# Patient Record
Sex: Male | Born: 1990 | Race: Black or African American | Hispanic: No | Marital: Single | Smoking: Current some day smoker
Health system: Southern US, Community
[De-identification: ages and names within clinical notes are randomized; demographics above are authoritative.]

## PROBLEM LIST (undated history)

## (undated) DIAGNOSIS — J45909 Unspecified asthma, uncomplicated: Secondary | ICD-10-CM

## (undated) HISTORY — PX: NO PAST SURGERIES: SHX2092

---

## 2013-07-19 ENCOUNTER — Emergency Department (HOSPITAL_COMMUNITY)
Admission: EM | Admit: 2013-07-19 | Discharge: 2013-07-19 | Disposition: A | Discharge status: Home / Self Care | Payer: BC Managed Care – PPO | Source: Home / Self Care

## 2013-07-19 ENCOUNTER — Encounter (HOSPITAL_COMMUNITY): Payer: Self-pay | Admitting: Emergency Medicine

## 2013-07-19 DIAGNOSIS — J45901 Unspecified asthma with (acute) exacerbation: Secondary | ICD-10-CM

## 2013-07-19 MED ORDER — ALBUTEROL SULFATE (2.5 MG/3ML) 0.083% IN NEBU: 2.5000 mg | INHALATION_SOLUTION | Freq: Four times a day (QID) | RESPIRATORY_TRACT | Status: DC | PRN

## 2013-07-19 MED ORDER — ALBUTEROL SULFATE HFA 108 (90 BASE) MCG/ACT IN AERS: 2.0000 | INHALATION_SPRAY | RESPIRATORY_TRACT | Status: DC | PRN

## 2013-07-19 MED ORDER — METHYLPREDNISOLONE 4 MG PO KIT: PACK | ORAL | Status: DC

## 2013-07-19 NOTE — ED Provider Notes (Signed)
Medical screening examination/treatment/procedure(s) were performed by resident physician or non-physician practitioner and as supervising physician I was immediately available for consultation/collaboration.   Japji Kok DOUGLAS MD.   Finn Altemose D Charleston Vierling, MD 07/19/13 1550 

## 2013-07-19 NOTE — Discharge Instructions (Signed)
Asthma Attack Prevention °Although there is no way to prevent asthma from starting, you can take steps to control the disease and reduce its symptoms. Learn about your asthma and how to control it. Take an active role to control your asthma by working with your health care provider to create and follow an asthma action plan. An asthma action plan guides you in: °· Taking your medicines properly. °· Avoiding things that set off your asthma or make your asthma worse (asthma triggers). °· Tracking your level of asthma control. °· Responding to worsening asthma. °· Seeking emergency care when needed. °To track your asthma, keep records of your symptoms, check your peak flow number using a handheld device that shows how well air moves out of your lungs (peak flow meter), and get regular asthma checkups.  °WHAT ARE SOME WAYS TO PREVENT AN ASTHMA ATTACK? °· Take medicines as directed by your health care provider. °· Keep track of your asthma symptoms and level of control. °· With your health care provider, write a detailed plan for taking medicines and managing an asthma attack. Then be sure to follow your action plan. Asthma is an ongoing condition that needs regular monitoring and treatment. °· Identify and avoid asthma triggers. Many outdoor allergens and irritants (such as pollen, mold, cold air, and air pollution) can trigger asthma attacks. Find out what your asthma triggers are and take steps to avoid them. °· Monitor your breathing. Learn to recognize warning signs of an attack, such as coughing, wheezing, or shortness of breath. Your lung function may decrease before you notice any signs or symptoms, so regularly measure and record your peak airflow with a home peak flow meter. °· Identify and treat attacks early. If you act quickly, you are less likely to have a severe attack. You will also need less medicine to control your symptoms. When your peak flow measurements decrease and alert you to an upcoming attack,  take your medicine as instructed and immediately stop any activity that may have triggered the attack. If your symptoms do not improve, get medical help. °· Pay attention to increasing quick-relief inhaler use. If you find yourself relying on your quick-relief inhaler, your asthma is not under control. See your health care provider about adjusting your treatment. °WHAT CAN MAKE MY SYMPTOMS WORSE? °A number of common things can set off or make your asthma symptoms worse and cause temporary increased inflammation of your airways. Keep track of your asthma symptoms for several weeks, detailing all the environmental and emotional factors that are linked with your asthma. When you have an asthma attack, go back to your asthma diary to see which factor, or combination of factors, might have contributed to it. Once you know what these factors are, you can take steps to control many of them. If you have allergies and asthma, it is important to take asthma prevention steps at home. Minimizing contact with the substance to which you are allergic will help prevent an asthma attack. Some triggers and ways to avoid these triggers are: °Animal Dander:  °Some people are allergic to the flakes of skin or dried saliva from animals with fur or feathers.  °· There is no such thing as a hypoallergenic dog or cat breed. All dogs or cats can cause allergies, even if they don't shed. °· Keep these pets out of your home. °· If you are not able to keep a pet outdoors, keep the pet out of your bedroom and other sleeping areas at all   times, and keep the door closed. °· Remove carpets and furniture covered with cloth from your home. If that is not possible, keep the pet away from fabric-covered furniture and carpets. °Dust Mites: °Many people with asthma are allergic to dust mites. Dust mites are tiny bugs that are found in every home in mattresses, pillows, carpets, fabric-covered furniture, bedcovers, clothes, stuffed toys, and other  fabric-covered items.  °· Cover your mattress in a special dust-proof cover. °· Cover your pillow in a special dust-proof cover, or wash the pillow each week in hot water. Water must be hotter than 130° F (54.4° C) to kill dust mites. Cold or warm water used with detergent and bleach can also be effective. °· Wash the sheets and blankets on your bed each week in hot water. °· Try not to sleep or lie on cloth-covered cushions. °· Call ahead when traveling and ask for a smoke-free hotel room. Bring your own bedding and pillows in case the hotel only supplies feather pillows and down comforters, which may contain dust mites and cause asthma symptoms. °· Remove carpets from your bedroom and those laid on concrete, if you can. °· Keep stuffed toys out of the bed, or wash the toys weekly in hot water or cooler water with detergent and bleach. °Cockroaches: °Many people with asthma are allergic to the droppings and remains of cockroaches.  °· Keep food and garbage in closed containers. Never leave food out. °· Use poison baits, traps, powders, gels, or paste (for example, boric acid). °· If a spray is used to kill cockroaches, stay out of the room until the odor goes away. °Indoor Mold: °· Fix leaky faucets, pipes, or other sources of water that have mold around them. °· Clean floors and moldy surfaces with a fungicide or diluted bleach. °· Avoid using humidifiers, vaporizers, or swamp coolers. These can spread molds through the air. °Pollen and Outdoor Mold: °· When pollen or mold spore counts are high, try to keep your windows closed. °· Stay indoors with windows closed from late morning to afternoon. Pollen and some mold spore counts are highest at that time. °· Ask your health care provider whether you need to take anti-inflammatory medicine or increase your dose of the medicine before your allergy season starts. °Other Irritants to Avoid: °· Tobacco smoke is an irritant. If you smoke, ask your health care provider how  you can quit. Ask family members to quit smoking too. Do not allow smoking in your home or car. °· If possible, do not use a wood-burning stove, kerosene heater, or fireplace. Minimize exposure to all sources of smoke, including to incense, candles, fires, and fireworks. °· Try to stay away from strong odors and sprays, such as perfume, talcum powder, hair spray, and paints. °· Decrease humidity in your home and use an indoor air cleaning device. Reduce indoor humidity to below 60%. Dehumidifiers or central air conditioners can do this. °· Decrease house dust exposure by changing furnace and air cooler filters frequently. °· Try to have someone else vacuum for you once or twice a week. Stay out of rooms while they are being vacuumed and for a short while afterward. °· If you vacuum, use a dust mask from a hardware store, a double-layered or microfilter vacuum cleaner bag, or a vacuum cleaner with a HEPA filter. °· Sulfites in foods and beverages can be irritants. Do not drink beer or wine or eat dried fruit, processed potatoes, or shrimp if they cause asthma symptoms. °·   Cold air can trigger an asthma attack. Cover your nose and mouth with a scarf on cold or windy days. °· Several health conditions can make asthma more difficult to manage, including a runny nose, sinus infections, reflux disease, psychological stress, and sleep apnea. Work with your health care provider to manage these conditions. °· Avoid close contact with people who have a respiratory infection such as a cold or the flu, since your asthma symptoms may get worse if you catch the infection. Wash your hands thoroughly after touching items that may have been handled by people with a respiratory infection. °· Get a flu shot every year to protect against the flu virus, which often makes asthma worse for days or weeks. Also get a pneumonia shot if you have not previously had one. Unlike the flu shot, the pneumonia shot does not need to be given  yearly. °Medicines: °· Talk to your health care provider about whether it is safe for you to take aspirin or non-steroidal anti-inflammatory medicines (NSAIDs). In a small number of people with asthma, aspirin and NSAIDs can cause asthma attacks. These medicines must be avoided by people who have known aspirin-sensitive asthma. It is important that people with aspirin-sensitive asthma read labels of all over-the-counter medicines used to treat pain, colds, coughs, and fever. °· Beta blockers and ACE inhibitors are other medicines you should discuss with your health care provider. °HOW CAN I FIND OUT WHAT I AM ALLERGIC TO? °Ask your asthma health care provider about allergy skin testing or blood testing (the RAST test) to identify the allergens to which you are sensitive. If you are found to have allergies, the most important thing to do is to try to avoid exposure to any allergens that you are sensitive to as much as possible. Other treatments for allergies, such as medicines and allergy shots (immunotherapy) are available.  °CAN I EXERCISE? °Follow your health care provider's advice regarding asthma treatment before exercising. It is important to maintain a regular exercise program, but vigorous exercise, or exercise in cold, humid, or dry environments can cause asthma attacks, especially for those people who have exercise-induced asthma. °Document Released: 04/09/2009 Document Revised: 12/22/2012 Document Reviewed: 10/27/2012 °[employer]® Patient Information ©2014 [employer]. ° °Asthma, Acute Bronchospasm °Acute bronchospasm caused by asthma is also referred to as an asthma attack. Bronchospasm means your air passages become narrowed. The narrowing is caused by inflammation and tightening of the muscles in the air tubes (bronchi) in your lungs. This can make it hard to breath or cause you to wheeze and cough. °CAUSES °Possible triggers are: °· Animal dander from the skin, hair, or feathers of animals. °· Dust  mites contained in house dust. °· Cockroaches. °· Pollen from trees or grass. °· Mold. °· Cigarette or tobacco smoke. °· Air pollutants such as dust, household cleaners, hair sprays, aerosol sprays, paint fumes, strong chemicals, or strong odors. °· Cold air or weather changes. Cold air may trigger inflammation. Winds increase molds and pollens in the air. °· Strong emotions such as crying or laughing hard. °· Stress. °· Certain medicines such as aspirin or beta-blockers. °· Sulfites in foods and drinks, such as dried fruits and wine. °· Infections or inflammatory conditions, such as a flu, cold, or inflammation of the nasal membranes (rhinitis). °· Gastroesophageal reflux disease (GERD). GERD is a condition where stomach acid backs up into your throat (esophagus). °· Exercise or strenuous activity. °SIGNS AND SYMPTOMS  °· Wheezing. °· Excessive coughing, particularly at night. °· Chest tightness. °·   Shortness of breath. °DIAGNOSIS  °Your health care provider will ask you about your medical history and perform a physical exam. A chest X-ray or blood testing may be performed to look for other causes of your symptoms or other conditions that may have triggered your asthma attack.  °TREATMENT  °Treatment is aimed at reducing inflammation and opening up the airways in your lungs.  Most asthma attacks are treated with inhaled medicines. These include quick relief or rescue medicines (such as bronchodilators) and controller medicines (such as inhaled corticosteroids). These medicines are sometimes given through an inhaler or a nebulizer. Systemic steroid medicine taken by mouth or given through an IV tube also can be used to reduce the inflammation when an attack is moderate or severe. Antibiotic medicines are only used if a bacterial infection is present.  °HOME CARE INSTRUCTIONS  °· Rest. °· Drink plenty of liquids. This helps the mucus to remain thin and be easily coughed up. Only use caffeine in moderation and do not  use alcohol until you have recovered from your illness. °· Do not smoke. Avoid being exposed to secondhand smoke. °· You play a critical role in keeping yourself in good health. Avoid exposure to things that cause you to wheeze or to have breathing problems. °· Keep your medicines up to date and available. Carefully follow your health care provider's treatment plan. °· Take your medicine exactly as prescribed. °· When pollen or pollution is bad, keep windows closed and use an air conditioner or go to places with air conditioning. °· Asthma requires careful medical care. See your health care provider for a follow-up as advised. If you are more than [redacted] weeks pregnant and you were prescribed any new medicines, let your obstetrician know about the visit and how you are doing. Follow-up with your health care provider as directed. °· After you have recovered from your asthma attack, make an appointment with your outpatient doctor to talk about ways to reduce the likelihood of future attacks. If you do not have a doctor who manages your asthma, make an appointment with a primary care doctor to discuss your asthma. °SEEK IMMEDIATE MEDICAL CARE IF:  °· You are getting worse. °· You have trouble breathing. If severe, call your local emergency services (911 in the [place] °· You develop chest pain or discomfort. °· You are vomiting. °· You are not able to keep fluids down. °· You are coughing up yellow, green, brown, or bloody sputum. °· You have a fever and your symptoms suddenly get worse. °· You have trouble swallowing. °MAKE SURE YOU:  °· Understand these instructions. °· Will watch your condition. °· Will get help right away if you are not doing well or get worse. °Document Released: 08/06/2006 Document Revised: 12/22/2012 Document Reviewed: 10/27/2012 °[employer]® Patient Information ©2014 [employer]. ° °

## 2013-07-19 NOTE — ED Provider Notes (Signed)
CSN: [id number]     Arrival date & time 07/19/13  1139 History   First MD Initiated Contact with Patient 07/19/13 1338     Chief Complaint  Patient presents with  . 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 It is associated with cough and shortness of breath with exertion. He is from [place] where his doctor he is. He is now out of his albuterol HFA and nebulizer solution.   History reviewed. No pertinent past medical history. No past surgical history on file. No family history on file. History  Substance Use Topics  . Smoking status: Not on file  . Smokeless tobacco: Not on file  . Alcohol Use: Not on file    Review of Systems  Constitutional: Positive for activity change. Negative for fever and fatigue.  HENT: Positive for postnasal drip. Negative for sore throat.   Respiratory: Positive for cough and wheezing. Negative for shortness of breath.   Cardiovascular: Negative.   Gastrointestinal: Negative.   Genitourinary: Negative.   Neurological: Negative.     Allergies  Review of patient's allergies indicates no known allergies.  Home Medications   Current Outpatient Rx  Name  Route  Sig  Dispense  Refill  . albuterol (PROVENTIL HFA;VENTOLIN HFA) 108 (90 BASE) MCG/ACT inhaler   Inhalation   Inhale 2 puffs into the lungs every 4 (four) hours as needed for wheezing or shortness of breath.   1 Inhaler   0   . albuterol (PROVENTIL) (2.5 MG/3ML) 0.083% nebulizer solution   Nebulization   Take 3 mLs (2.5 mg total) by nebulization every 6 (six) hours as needed for wheezing or shortness of breath.   75 mL   0   . methylPREDNISolone (MEDROL DOSEPAK) 4 MG tablet      follow package directions   21 tablet   0    BP 130/78  Pulse 84  Temp(Src) 97.8 F (36.6 C)  (Oral)  Resp 16  SpO2 99% Physical Exam  Nursing note and vitals reviewed. Constitutional: He is oriented to person, place, and time. He appears well-developed and well-nourished. No distress.  HENT:  Mouth/Throat: Oropharynx is clear and moist. No oropharyngeal exudate.  Mild clear PND  Eyes: EOM are normal.  Neck: Normal range of motion. Neck supple.  Cardiovascular: Normal rate and regular rhythm.   Pulmonary/Chest: Effort normal. No respiratory distress.  Air movement is fair to good. Mild coarseness  and  wheeze with forced expiration and cough.  Musculoskeletal: He exhibits no edema.  Lymphadenopathy:    He has no cervical adenopathy.  Neurological: He is alert and oriented to person, place, and time. He exhibits normal muscle tone.  Skin: Skin is warm and dry. He is not diaphoretic.  Psychiatric: He has a normal mood and affect.    ED Course  Procedures (including critical care time) Labs Review Labs Reviewed - No data to display Imaging Review No results found.   MDM   1. Asthma, intrinsic with exacerbation      Albuterol HFA Albuterol neb sol .083% Medrol dose pack.       Hayden Rasmussenavid Johncarlos Holtsclaw, NP 07/19/13 1356

## 2013-07-19 NOTE — ED Notes (Signed)
C/o asthma States he is out of medication and would like a refill on albuterol since yesterday States he is having trouble breathing No PCP

## 2013-08-22 ENCOUNTER — Emergency Department (HOSPITAL_COMMUNITY)
Admission: EM | Admit: 2013-08-22 | Discharge: 2013-08-22 | Disposition: A | Discharge status: Home / Self Care | Payer: BC Managed Care – PPO | Attending: Emergency Medicine | Admitting: Emergency Medicine

## 2013-08-22 ENCOUNTER — Encounter (HOSPITAL_COMMUNITY): Payer: Self-pay | Admitting: Emergency Medicine

## 2013-08-22 DIAGNOSIS — S80211A Abrasion, right knee, initial encounter: Secondary | ICD-10-CM

## 2013-08-22 DIAGNOSIS — S0512XA Contusion of eyeball and orbital tissues, left eye, initial encounter: Secondary | ICD-10-CM

## 2013-08-22 DIAGNOSIS — S0510XA Contusion of eyeball and orbital tissues, unspecified eye, initial encounter: Secondary | ICD-10-CM | POA: Insufficient documentation

## 2013-08-22 DIAGNOSIS — IMO0002 Reserved for concepts with insufficient information to code with codable children: Secondary | ICD-10-CM | POA: Insufficient documentation

## 2013-08-22 DIAGNOSIS — J45909 Unspecified asthma, uncomplicated: Secondary | ICD-10-CM | POA: Insufficient documentation

## 2013-08-22 DIAGNOSIS — Z79899 Other long term (current) drug therapy: Secondary | ICD-10-CM | POA: Insufficient documentation

## 2013-08-22 DIAGNOSIS — S80212A Abrasion, left knee, initial encounter: Secondary | ICD-10-CM

## 2013-08-22 HISTORY — DX: Unspecified asthma, uncomplicated: J45.909
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
## 2013-08-22 NOTE — Discharge Instructions (Signed)
You may use tylneol and ibuprofen as needed for pain as well as alternate ice and heat for facial pain relief. See below for further instruction.
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
## 2013-08-25 NOTE — ED Provider Notes (Signed)
Medical screening examination/treatment/procedure(s) were performed by non-physician practitioner and as supervising physician I was immediately available for consultation/collaboration.   EKG Interpretation None        Dagmar HaitWilliam Vondell Sowell, MD 08/25/13 1751

## 2015-07-20 ENCOUNTER — Ambulatory Visit
Admission: EM | Admit: 2015-07-20 | Discharge: 2015-07-20 | Disposition: A | Discharge status: Home / Self Care | Payer: Self-pay | Attending: Family Medicine | Admitting: Family Medicine

## 2015-07-20 DIAGNOSIS — J101 Influenza due to other identified influenza virus with other respiratory manifestations: Secondary | ICD-10-CM

## 2015-07-20 DIAGNOSIS — Z760 Encounter for issue of repeat prescription: Secondary | ICD-10-CM

## 2015-07-20 LAB — RAPID INFLUENZA A&B ANTIGENS (ARMC ONLY): INFLUENZA A (ARMC): NEGATIVE

## 2015-07-20 LAB — RAPID INFLUENZA A&B ANTIGENS: Influenza B (ARMC): POSITIVE — AB

## 2015-07-20 MED ORDER — IBUPROFEN 800 MG PO TABS
800.0000 mg | ORAL_TABLET | Freq: Once | ORAL | Status: AC
  Administered 2015-07-20: 800 mg via ORAL

## 2015-07-20 MED ORDER — ALBUTEROL SULFATE HFA 108 (90 BASE) MCG/ACT IN AERS: 2.0000 | INHALATION_SPRAY | RESPIRATORY_TRACT | Status: DC | PRN

## 2015-07-20 MED ORDER — BENZONATATE 100 MG PO CAPS: 100.0000 mg | ORAL_CAPSULE | Freq: Three times a day (TID) | ORAL | Status: DC | PRN

## 2015-07-20 MED ORDER — OSELTAMIVIR PHOSPHATE 75 MG PO CAPS: 75.0000 mg | ORAL_CAPSULE | Freq: Two times a day (BID) | ORAL | Status: DC

## 2015-07-20 MED ORDER — ALBUTEROL SULFATE (2.5 MG/3ML) 0.083% IN NEBU: 2.5000 mg | INHALATION_SOLUTION | Freq: Four times a day (QID) | RESPIRATORY_TRACT | Status: DC | PRN

## 2015-07-20 NOTE — ED Provider Notes (Signed)
[facility]  ____________________________________________  Time seen: Approximately 9:31 PM  I have reviewed the triage vital signs and the nursing notes.   HISTORY  Chief Complaint Fever  HPI Curtis Rodgers is a 25 y.o. male presents with mother at bedside for the complaints of runny nose, nasal congestion, cough, body aches and subjective fever since yesterday. Denies sore throat. States cough is a dry cough. Reports did not measure fever at home. States he has not been wheezing with his cough. Reports continues to drink fluids well but slight decrease in appetite. Denies known sick contacts. Patient mother reports that patient has a history of asthma and does not currently have any albuterol nebulizer solutions or inhaler. Patient mother states that they're nervous that patient may end up needing these medicines in which she does not have. Patient mother reports that his asthma is well controlled however occasionally when he is sick he will wheeze.  Reports has only taken over-the-counter Mucinex this morning. Reports has not taken any over-the-counter Tylenol or ibuprofen today.  Denies chest pain, shortness of breath, dizziness, weakness, hearing changes, abdominal pain, dysuria, neck pain, back pain, extremity pain or extremity swelling. Denies recent sickness.    Past Medical History  Diagnosis Date  . Asthma     There are no active problems to display for this patient.   Past Surgical History  Procedure Laterality Date  . No past surgeries      Current Outpatient Rx  Name  Route  Sig  Dispense  Refill  .           .           .             Allergies Review of patient's allergies indicates no known allergies.  History reviewed. No pertinent family history.  Social History Social History  Substance Use Topics  . Smoking status: Current Some Day Smoker    Types: E-cigarettes  . Smokeless tobacco: None  . Alcohol Use: 1.8 oz/week    3 Cans of beer per  week    Review of Systems Constitutional: Positive fevers. Eyes: No visual changes. ENT: No sore throat. Positive runny nose, nasal congestion and cough. Cardiovascular: Denies chest pain. Respiratory: Denies shortness of breath. Gastrointestinal: No abdominal pain.  No nausea, no vomiting.  No diarrhea.  No constipation. Genitourinary: Negative for dysuria. Musculoskeletal: Negative for back pain. Skin: Negative for rash. Neurological: Negative for headaches, focal weakness or numbness.  10-point ROS otherwise negative.  ____________________________________________   PHYSICAL EXAM:  VITAL SIGNS: ED Triage Vitals  Enc Vitals Group     BP 07/20/15 2045 137/98 mmHg     Pulse Rate 07/20/15 2045 110     Resp 07/20/15 2045 18     Temp 07/20/15 2045 100.8 F (38.2 C)     Temp Source 07/20/15 2045 Tympanic     SpO2 07/20/15 2045 98 %     Weight 07/20/15 2045 175 lb (79.379 kg)     Height 07/20/15 2045 5\' 10"  (1.778 m)     Head Cir --      Peak Flow --      Pain Score 07/20/15 2047 6     Pain Loc --      Pain Edu? --      Excl. in GC? --   Constitutional: Alert and oriented. Well appearing and in no acute distress. Eyes: Conjunctivae are normal. PERRL. EOMI. Head: Atraumatic. No sinus tenderness to palpation. No  swelling. No erythema.  Ears: no erythema, normal TMs bilaterally.   Nose:Nasal congestion with clear rhinorrhea  Mouth/Throat: Mucous membranes are moist. No pharyngeal erythema. No tonsillar swelling or exudate.  Neck: No stridor.  No cervical spine tenderness to palpation. Hematological/Lymphatic/Immunilogical: No cervical lymphadenopathy. Cardiovascular: Normal rate, regular rhythm. Grossly normal heart sounds.  Good peripheral circulation. Respiratory: Normal respiratory effort.  No retractions. Lungs CTAB. No wheezes, rales or rhonchi. Good air movement.  Gastrointestinal: Soft and nontender. Normal Bowel sounds. No CVA tenderness. Musculoskeletal: No lower or  upper extremity tenderness nor edema. No cervical, thoracic or lumbar tenderness to palpation. Neurologic:  Normal speech and language. No gross focal neurologic deficits are appreciated. No gait instability. Skin:  Skin is warm, dry and intact. No rash noted. Psychiatric: Mood and affect are normal. Speech and behavior are normal.  ____________________________________________   LABS (all labs ordered are listed, but only abnormal results are displayed)  Labs Reviewed  RAPID INFLUENZA A&B ANTIGENS ([facility] ONLY) - Abnormal; Notable for the following:    Influenza B ([facility]) POSITIVE (*)    All other components within normal limits     INITIAL IMPRESSION / ASSESSMENT AND PLAN / ED COURSE  Pertinent labs & imaging results that were available during my care of the patient were reviewed by me and considered in my medical decision making (see chart for details).  Very well-appearing patient. No acute distress. Mother at bedside. Presents for the complaints of 2 days of runny nose, nasal congestion, cough and subjective fevers which were sudden onset yesterday. Lungs clear throughout. Abdomen soft and nontender. Moist mucous membranes. Suspect viral infection.ibuprofen orally given in urgent care once.   Influenza B positive. Discussed treatment options with patient and mother. Patient mother requests prescription for oral Tamiflu. Will treat patient with oral Tamiflu and when necessary Tessalon Perles. Mother and patient requests prescriptions refills for albuterol inhaler as well as albuterol nebulization solution as they are currently out of these and worried that they may need these medicines with his asthma, prescriptions given. Discussed indication, risks and benefits of medications with patient. Encouraged rest, fluids, over-the-counter Tylenol or ibuprofen as needed. Encourage PCP follow-up as needed.   Discussed follow up with Primary care physician this week. Discussed follow up and return  parameters to urgent care or ER including chest pain, shortness breath, persistent fevers, no resolution or any worsening concerns. Patient verbalized understanding and agreed to plan.   ____________________________________________   FINAL CLINICAL IMPRESSION(S) / ED DIAGNOSES  Final diagnoses:  Influenza B  Medication refill      Note: This dictation was prepared with Dragon dictation along with smaller phrase technology. Any transcriptional errors that result from this process are unintentional.    Renford Dills, NP 07/20/15 2250

## 2015-07-20 NOTE — Discharge Instructions (Signed)
Take medication as prescribed. Rest. Drink plenty of fluids.  ° °Follow up with your primary care physician this week as needed. Return to Urgent care for new or worsening concerns.  ° ° °Influenza, Adult °Influenza ("the flu") is a viral infection of the respiratory tract. It occurs more often in winter months because people spend more time in close contact with one another. Influenza can make you feel very sick. Influenza easily spreads from person to person (contagious). °CAUSES  °Influenza is caused by a virus that infects the respiratory tract. You can catch the virus by breathing in droplets from an infected person's cough or sneeze. You can also catch the virus by touching something that was recently contaminated with the virus and then touching your mouth, nose, or eyes. °RISKS AND COMPLICATIONS °You may be at risk for a more severe case of influenza if you smoke cigarettes, have diabetes, have chronic heart disease (such as heart failure) or lung disease (such as asthma), or if you have a weakened immune system. Elderly people and pregnant women are also at risk for more serious infections. The most common problem of influenza is a lung infection (pneumonia). Sometimes, this problem can require emergency medical care and may be life threatening. °SIGNS AND SYMPTOMS  °Symptoms typically last 4 to 10 days and may include: °· Fever. °· Chills. °· Headache, body aches, and muscle aches. °· Sore throat. °· Chest discomfort and cough. °· Poor appetite. °· Weakness or feeling tired. °· Dizziness. °· Nausea or vomiting. °DIAGNOSIS  °Diagnosis of influenza is often made based on your history and a physical exam. A nose or throat swab test can be done to confirm the diagnosis. °TREATMENT  °In mild cases, influenza goes away on its own. Treatment is directed at relieving symptoms. For more severe cases, your health care provider may prescribe antiviral medicines to shorten the sickness. Antibiotic medicines are not  effective because the infection is caused by a virus, not by bacteria. °HOME CARE INSTRUCTIONS °· Take medicines only as directed by your health care provider. °· Use a cool mist humidifier to make breathing easier. °· Get plenty of rest until your temperature returns to normal. This usually takes 3 to 4 days. °· Drink enough fluid to keep your urine clear or pale yellow. °· Cover your mouth and nose when coughing or sneezing, and wash your hands well to prevent the virus from spreading. °· Stay home from work or school until the fever is gone for at least 1 full day. °PREVENTION  °An annual influenza vaccination (flu shot) is the best way to avoid getting influenza. An annual flu shot is now routinely recommended for all adults in the [place] °SEEK MEDICAL CARE IF: °· You experience chest pain, your cough worsens, or you produce more mucus. °· You have nausea, vomiting, or diarrhea. °· Your fever returns or gets worse. °SEEK IMMEDIATE MEDICAL CARE IF: °· You have trouble breathing, you become short of breath, or your skin or nails become bluish. °· You have severe pain or stiffness in the neck. °· You develop a sudden headache, or pain in the face or ear. °· You have nausea or vomiting that you cannot control. °MAKE SURE YOU:  °· Understand these instructions. °· Will watch your condition. °· Will get help right away if you are not doing well or get worse. °  °This information is not intended to replace advice given to you by your health care provider. Make sure you discuss any questions you have with your health care provider. °  °Document Released: 04/18/2000 Document Revised: 05/12/2014 Document Reviewed: 07/21/2011 °Elsevier Interactive Patient Education ©2016 [employer] ° °

## 2015-07-20 NOTE — ED Notes (Signed)
Patient complains of fever, cough, congestion, wheezing and sore throat. Patient states that he is asthmatic and is currently out of his albuterol inhaler. Patient states that symptoms started yesterday.

## 2015-09-15 ENCOUNTER — Encounter (HOSPITAL_COMMUNITY): Payer: Self-pay | Admitting: *Deleted

## 2015-09-15 DIAGNOSIS — S0081XA Abrasion of other part of head, initial encounter: Secondary | ICD-10-CM | POA: Insufficient documentation

## 2015-09-15 DIAGNOSIS — F17210 Nicotine dependence, cigarettes, uncomplicated: Secondary | ICD-10-CM | POA: Insufficient documentation

## 2015-09-15 DIAGNOSIS — Y998 Other external cause status: Secondary | ICD-10-CM | POA: Insufficient documentation

## 2015-09-15 DIAGNOSIS — Y9389 Activity, other specified: Secondary | ICD-10-CM | POA: Insufficient documentation

## 2015-09-15 DIAGNOSIS — S022XXA Fracture of nasal bones, initial encounter for closed fracture: Secondary | ICD-10-CM | POA: Insufficient documentation

## 2015-09-15 DIAGNOSIS — S20319A Abrasion of unspecified front wall of thorax, initial encounter: Secondary | ICD-10-CM | POA: Insufficient documentation

## 2015-09-15 DIAGNOSIS — J45909 Unspecified asthma, uncomplicated: Secondary | ICD-10-CM | POA: Insufficient documentation

## 2015-09-15 DIAGNOSIS — S0240DA Maxillary fracture, left side, initial encounter for closed fracture: Secondary | ICD-10-CM | POA: Insufficient documentation

## 2015-09-15 DIAGNOSIS — S0232XA Fracture of orbital floor, left side, initial encounter for closed fracture: Secondary | ICD-10-CM | POA: Insufficient documentation

## 2015-09-15 DIAGNOSIS — Z23 Encounter for immunization: Secondary | ICD-10-CM | POA: Insufficient documentation

## 2015-09-15 DIAGNOSIS — S1091XA Abrasion of unspecified part of neck, initial encounter: Secondary | ICD-10-CM | POA: Insufficient documentation

## 2015-09-15 DIAGNOSIS — Y9289 Other specified places as the place of occurrence of the external cause: Secondary | ICD-10-CM | POA: Insufficient documentation
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
## 2015-09-16 ENCOUNTER — Emergency Department (HOSPITAL_COMMUNITY)
Admission: EM | Admit: 2015-09-16 | Discharge: 2015-09-16 | Disposition: A | Discharge status: Home / Self Care | Payer: Self-pay | Attending: Emergency Medicine | Admitting: Emergency Medicine

## 2015-09-16 ENCOUNTER — Emergency Department (HOSPITAL_COMMUNITY): Payer: Self-pay

## 2015-09-16 DIAGNOSIS — S022XXA Fracture of nasal bones, initial encounter for closed fracture: Secondary | ICD-10-CM

## 2015-09-16 DIAGNOSIS — T07XXXA Unspecified multiple injuries, initial encounter: Secondary | ICD-10-CM

## 2015-09-16 DIAGNOSIS — S0990XA Unspecified injury of head, initial encounter: Secondary | ICD-10-CM

## 2015-09-16 DIAGNOSIS — S0240DA Maxillary fracture, left side, initial encounter for closed fracture: Secondary | ICD-10-CM

## 2015-09-16 DIAGNOSIS — S0232XA Fracture of orbital floor, left side, initial encounter for closed fracture: Secondary | ICD-10-CM

## 2015-09-16 MED ORDER — BACITRACIN ZINC 500 UNIT/GM EX OINT
TOPICAL_OINTMENT | Freq: Once | CUTANEOUS | Status: AC
  Administered 2015-09-16: 1 via TOPICAL

## 2015-09-16 MED ORDER — IBUPROFEN 800 MG PO TABS: 800.0000 mg | ORAL_TABLET | Freq: Three times a day (TID) | ORAL | Status: DC | PRN

## 2015-09-16 MED ORDER — TETANUS-DIPHTH-ACELL PERTUSSIS 5-2.5-18.5 LF-MCG/0.5 IM SUSP
0.5000 mL | Freq: Once | INTRAMUSCULAR | Status: AC
  Administered 2015-09-16: 0.5 mL via INTRAMUSCULAR
  Filled 2015-09-16: qty 0.5

## 2015-09-16 MED ORDER — LIDOCAINE HCL (PF) 1 % IJ SOLN
5.0000 mL | Freq: Once | INTRAMUSCULAR | Status: DC
  Filled 2015-09-16: qty 5

## 2015-09-16 MED ORDER — FLUORESCEIN SODIUM 1 MG OP STRP
1.0000 | ORAL_STRIP | Freq: Once | OPHTHALMIC | Status: AC
  Administered 2015-09-16: 1 via OPHTHALMIC
  Filled 2015-09-16: qty 1

## 2015-09-16 MED ORDER — OXYCODONE-ACETAMINOPHEN 5-325 MG PO TABS
2.0000 | ORAL_TABLET | Freq: Once | ORAL | Status: AC
  Administered 2015-09-16: 2 via ORAL
  Filled 2015-09-16: qty 2

## 2015-09-16 MED ORDER — OXYCODONE-ACETAMINOPHEN 5-325 MG PO TABS: 1.0000 | ORAL_TABLET | Freq: Four times a day (QID) | ORAL | Status: DC | PRN

## 2015-09-16 MED ORDER — TETRACAINE HCL 0.5 % OP SOLN
2.0000 [drp] | Freq: Once | OPHTHALMIC | Status: AC
  Administered 2015-09-16: 2 [drp] via OPHTHALMIC
  Filled 2015-09-16: qty 2

## 2015-09-16 NOTE — ED Notes (Signed)
Taken to CT at this time. 
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
## 2015-09-16 NOTE — Discharge Instructions (Signed)
Head Injury, Adult You have a head injury. Headaches and throwing up (vomiting) are common after a head injury. It should be easy to wake up from sleeping. Sometimes you must stay in the hospital. Most problems happen within the first 24 hours. Side effects may occur up to 7-10 days after the injury.  WHAT ARE THE TYPES OF HEAD INJURIES? Head injuries can be as minor as a bump. Some head injuries can be more severe. More severe head injuries include:  A jarring injury to the brain (concussion).  A bruise of the brain (contusion). This mean there is bleeding in the brain that can cause swelling.  A cracked skull (skull fracture).  Bleeding in the brain that collects, clots, and forms a bump (hematoma). WHEN SHOULD I GET HELP RIGHT AWAY?   You are confused or sleepy.  You cannot be woken up.  You feel sick to your stomach (nauseous) or keep throwing up (vomiting).  Your dizziness or unsteadiness is getting worse.  You have very bad, lasting headaches that are not helped by medicine. Take medicines only as told by your doctor.  You cannot use your arms or legs like normal.  You cannot walk.  You notice changes in the black spots in the center of the colored part of your eye (pupil).  You have clear or bloody fluid coming from your nose or ears.  You have trouble seeing. During the next 24 hours after the injury, you must stay with someone who can watch you. This person should get help right away (call 911 in the [place] if you start to shake and are not able to control it (have seizures), you pass out, or you are unable to wake up. HOW CAN I PREVENT A HEAD INJURY IN THE FUTURE?  Wear seat belts.  Wear a helmet while bike riding and playing sports like football.  Stay away from dangerous activities around the house. WHEN CAN I RETURN TO NORMAL ACTIVITIES AND ATHLETICS? See your doctor before doing these activities. You should not do normal activities or play contact sports until 1  week after the following symptoms have stopped:  Headache that does not go away.  Dizziness.  Poor attention.  Confusion.  Memory problems.  Sickness to your stomach or throwing up.  Tiredness.  Fussiness.  Bothered by bright lights or loud noises.  Anxiousness or depression.  Restless sleep. MAKE SURE YOU:   Understand these instructions.  Will watch your condition.  Will get help right away if you are not doing well or get worse.   This information is not intended to replace advice given to you by your health care provider. Make sure you discuss any questions you have with your health care provider.   Document Released: 04/03/2008 Document Revised: 05/12/2014 Document Reviewed: 12/27/2012 Elsevier Interactive Patient Education 2016 Else[employer] Inc[employer]  Nasal Fracture A nasal fracture is a break or crack in the bones or cartilage of the nose. Minor breaks do not require treatment. These breaks usually heal on their own after about one month. Serious breaks may require surgery. CAUSES This injury is usually caused by a blunt injury to the nose. This type of injury often occurs from:  Contact sports.  Car accidents.  Falls.  Getting punched. SYMPTOMS Symptoms of this injury include:  Pain.  Swelling of the nose.  Bleeding from the nose.  Bruising around the nose or eyes. This may include having black eyes.  Crooked appearance of the nose. DIAGNOSIS This injury may  be diagnosed with a physical exam. The health care provider will gently feel the nose for signs of broken bones. He or she will look inside the nostrils to make sure that there is not a blood-filled swelling on the dividing wall between the nostrils (septal hematoma). X-rays of the nose may not show a nasal fracture even when one is present. In some cases, X-rays or a CT scan may be done 1-5 days after the injury. Sometimes, the health care provider will want to wait until the swelling has gone  down. TREATMENT Often, minor fractures that have caused no deformity do not require treatment. More serious fractures in which bones have moved out of position may require surgery, which will take place after the swelling is gone. Surgery will stabilize and align the fracture. In some cases, a health care provider may be able to reposition the bones without surgery. This may be done in the health care provider's office after medicine is given to numb the area (local anesthetic). HOME CARE INSTRUCTIONS  If directed, apply ice to the injured area:  Put ice in a plastic bag.  Place a towel between your skin and the bag.  Leave the ice on for 20 minutes, 2-3 times per day.  Take over-the-counter and prescription medicines only as told by your health care provider.  If your nose starts to bleed, sit in an upright position while you squeeze the soft parts of your nose against the dividing wall between your nostrils (septum) for 10 minutes.  Try to avoid blowing your nose.  Return to your normal activities as told by your health care provider. Ask your health care provider what activities are safe for you.  Avoid contact sports for 3-4 weeks or as told by your health care provider.  Keep all follow-up visits as told by your health care provider. This is important. SEEK MEDICAL CARE IF:  Your pain increases or becomes severe.  You continue to have nosebleeds.  The shape of your nose does not return to normal within 5 days.  You have pus draining out of your nose. SEEK IMMEDIATE MEDICAL CARE IF:  You have bleeding from your nose that does not stop after you pinch your nostrils closed for 20 minutes and keep ice on your nose.  You have clear fluid draining out of your nose.  You notice a grape-like swelling on the septum. This swelling is a collection of blood (hematoma) that must be drained to help prevent infection.  You have difficulty moving your eyes.  You have repeated  vomiting.   This information is not intended to replace advice given to you by your health care provider. Make sure you discuss any questions you have with your health care provider.   Document Released: 04/18/2000 Document Revised: 01/10/2015 Document Reviewed: 05/29/2014 Elsevier Interactive Patient Education 2016 Else[employer] Inc.   Orbital Floor Fracture, Non-Blowout The eye sits in the part of the skull called the "orbit." The upper and outside walls of the orbit are thick and strong. The inside wall (near the nose) and the orbital floor are very thin and weak. The tissues around the eye will briefly press together if there is a direct blow to the front of the eye. This leads to high pressure against the orbital walls. The inside wall and the orbital floor may break since these are the weakest walls. If the orbital floor breaks, the tissues around the eye, including the muscle that makes the eye look down, may become  trapped in the sinus below when the orbital floor "blows out." If a blowout does not happen, the orbital floor fracture is considered a non-blowout orbital fracture. CAUSES An orbital floor fracture can be caused by any accident in which an object hits the face or the face strikes against a hard object. The most common ways that people break their eye socket include:  Being hit by a blunt object, such as a baseball bat or a fist.  Striking the face on the car dashboard during a crash.  Falls.  Gunshot. SYMPTOMS  If there has been no injury to the eye itself, symptoms may include:  Puffiness (swelling) and bruising around the eye area (black eye).  Numbness of the cheek and upper gum on the side with the floor fracture. This is caused by nerve injury to these areas.  Pain around the eye.  Headache.  Ear pain on the injured side. DIAGNOSIS The diagnosis of an orbital floor fracture is suspected during an eye exam by an ophthalmologist. It is confirmed by X-rays or CT  scan. TREATMENT Your caregiver may suggest waiting 1 or 2 weeks for the swelling to go away before examining the eye. When the swelling lessens, your caregiver will examine the eye to see if there is any sign of a trapped muscle or double vision when looking in different directions. If double vision is not found and muscle or tissue did not get trapped, no further treatment is necessary. After that, in almost all cases, the bones heal together on their own.  HOME CARE INSTRUCTIONS  Take all pain medicine as directed by your caregiver.  Use ice packs or other cold therapy to reduce swelling as directed by your caregiver.  Do not put a contact lens in the injured eye until your caregiver approves.  Avoid dusty environments.  Always wear protective glasses or goggles when recommended. Wearing protective eyewear is not dangerous to your injured eye and will not delay healing.  As long as your other eye is seeing normally, you may return to work and drive.  You may travel by plane or be in high altitudes. However, your swelling may take longer to go away, and you may have sinus pain.  Be aware that your depth perception and your ability to judge distance may be reduced or lost. SEEK IMMEDIATE MEDICAL CARE IF:  Your vision changes.  Your redness or swelling persists around the injured eye or gets worse.  You start to have double vision.  You have a bloody or discolored discharge from your nose.  You have a fever that lasts longer than 2 to 3 days.  You have a fever that suddenly gets worse.  Your cheek or upper gum numbness does not go away. MAKE SURE YOU:  Understand these instructions.  Will watch your condition.  Will get help right away if you are not doing well or get worse.   This information is not intended to replace advice given to you by your health care provider. Make sure you discuss any questions you have with your health care provider.   Document Released:  07/14/2011 Document Revised: 05/12/2014 Document Reviewed: 07/14/2011 Elsevier Interactive Patient Education [employer]

## 2016-08-18 ENCOUNTER — Ambulatory Visit (HOSPITAL_COMMUNITY)
Admission: EM | Admit: 2016-08-18 | Discharge: 2016-08-18 | Disposition: A | Discharge status: Home / Self Care | Payer: Self-pay | Attending: Internal Medicine | Admitting: Internal Medicine

## 2016-08-18 ENCOUNTER — Encounter (HOSPITAL_COMMUNITY): Payer: Self-pay | Admitting: Emergency Medicine

## 2016-08-18 DIAGNOSIS — S01511A Laceration without foreign body of lip, initial encounter: Secondary | ICD-10-CM

## 2016-08-18 DIAGNOSIS — Z23 Encounter for immunization: Secondary | ICD-10-CM

## 2016-08-18 MED ORDER — HYDROCODONE-ACETAMINOPHEN 5-325 MG PO TABS: 1.0000 | ORAL_TABLET | ORAL | 0 refills | Status: DC | PRN

## 2016-08-18 MED ORDER — TETANUS-DIPHTH-ACELL PERTUSSIS 5-2.5-18.5 LF-MCG/0.5 IM SUSP
0.5000 mL | Freq: Once | INTRAMUSCULAR | Status: AC
  Administered 2016-08-18: 0.5 mL via INTRAMUSCULAR

## 2016-08-18 MED ORDER — TETANUS-DIPHTH-ACELL PERTUSSIS 5-2.5-18.5 LF-MCG/0.5 IM SUSP
INTRAMUSCULAR | Status: AC
  Filled 2016-08-18: qty 0.5
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
## 2016-08-25 ENCOUNTER — Ambulatory Visit (HOSPITAL_COMMUNITY)
Admission: EM | Admit: 2016-08-25 | Discharge: 2016-08-25 | Disposition: A | Discharge status: Home / Self Care | Payer: Self-pay

## 2016-08-25 ENCOUNTER — Encounter (HOSPITAL_COMMUNITY): Payer: Self-pay | Admitting: Emergency Medicine

## 2016-08-25 DIAGNOSIS — Z4802 Encounter for removal of sutures: Secondary | ICD-10-CM

## 2016-08-25 NOTE — ED Triage Notes (Signed)
Pt here for suture removal to right upper lip laceration.

## 2016-08-25 NOTE — ED Notes (Signed)
Two sutures removed from right upper lip laceration. Wound was clean, dry, and intact.
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
## 2017-05-14 ENCOUNTER — Other Ambulatory Visit: Payer: Self-pay

## 2017-05-14 ENCOUNTER — Encounter: Payer: Self-pay | Admitting: Emergency Medicine

## 2017-05-14 ENCOUNTER — Emergency Department (INDEPENDENT_AMBULATORY_CARE_PROVIDER_SITE_OTHER)
Admission: EM | Admit: 2017-05-14 | Discharge: 2017-05-14 | Disposition: A | Discharge status: Home / Self Care | Payer: Self-pay | Source: Home / Self Care | Attending: Family Medicine | Admitting: Family Medicine

## 2017-05-14 DIAGNOSIS — Z202 Contact with and (suspected) exposure to infections with a predominantly sexual mode of transmission: Secondary | ICD-10-CM

## 2017-05-14 NOTE — ED Provider Notes (Signed)
[facility]    CSN: [id number] Arrival date & time: 05/14/17  1923     History   Chief Complaint Chief Complaint  Patient presents with  . Exposure to STD    HPI Curtis Rodgers is a 27 y.o. male.   Patient presents for STD testing.  He reports that his girl friend tested positive for gonorrhea three days ago.  He is completely assymptomatic.  He has no past history of STD or testing.    Exposure to STD  This is a new problem. Episode onset: 3 days ago. The problem has not changed since onset.Associated symptoms comments: none.    Past Medical History:  Diagnosis Date  . Asthma     There are no active problems to display for this patient.   Past Surgical History:  Procedure Laterality Date  . NO PAST SURGERIES         Home Medications    Prior to Admission medications   Medication Sig Start Date End Date Taking? Authorizing Provider  ibuprofen (ADVIL,MOTRIN) 800 MG tablet Take 1 tablet (800 mg total) by mouth every 8 (eight) hours as needed for mild pain. 09/16/15   Ward, Layla Maw, DO  naproxen sodium (ALEVE) 220 MG tablet Take 440 mg by mouth 2 (two) times daily as needed (for headache).     [provider]    Family History No family history on file.  Social History Social History   Tobacco Use  . Smoking status: Current Some Day Smoker    Types: E-cigarettes  . Smokeless tobacco: Never Used  Substance Use Topics  . Alcohol use: Yes    Alcohol/week: 1.8 oz    Types: 3 Cans of beer per week  . Drug use: No     Allergies   Patient has no known allergies.   Review of Systems Review of Systems  Constitutional: Negative for activity change, chills, diaphoresis, fatigue, fever and unexpected weight change.  HENT: Negative for sore throat.   Eyes: Negative.   Respiratory: Negative.   Cardiovascular: Negative.   Gastrointestinal: Negative.   Genitourinary: Negative for discharge, dysuria, flank pain, frequency, genital  sores, hematuria, penile pain, penile swelling, scrotal swelling, testicular pain and urgency.  Musculoskeletal: Negative.   Skin: Negative for rash.  Neurological: Negative.      Physical Exam Triage Vital Signs ED Triage Vitals [05/14/17 1941]  Enc Vitals Group     BP (!) 155/88     Pulse Rate 89     Resp      Temp 97.9 F (36.6 C)     Temp Source Oral     SpO2 99 %     Weight 180 lb (81.6 kg)     Height 5\' 10"  (1.778 m)     Head Circumference      Peak Flow      Pain Score 0     Pain Loc      Pain Edu?      Excl. in GC?    No data found.  Updated Vital Signs BP (!) 155/88 (BP Location: Right Arm)   Pulse 89   Temp 97.9 F (36.6 C) (Oral)   Ht 5\' 10"  (1.778 m)   Wt 180 lb (81.6 kg)   SpO2 99%   BMI 25.83 kg/m   Visual Acuity Right Eye Distance:   Left Eye Distance:   Bilateral Distance:    Right Eye Near:   Left Eye Near:    Bilateral Near:  Physical Exam Nursing notes and Vital Signs reviewed. Appearance:  Patient appears stated age, and in no acute distress.    Eyes:  Pupils are equal, round, and reactive to light and accomodation.  Extraocular movement is intact.  Conjunctivae are not inflamed   Pharynx:  Normal; moist mucous membranes  Neck:  Supple.  No adenopathy Lungs:  Clear to auscultation.  Breath sounds are equal.  Moving air well. Heart:  Regular rate and rhythm without murmurs, rubs, or gallops.  Abdomen:  Nontender without masses or hepatosplenomegaly.  Bowel sounds are present.  No CVA or flank tenderness.  Extremities:  No edema.  Skin:  No rash present.    Genitourinary:  Penis normal without lesions or urethral discharge.  Scrotum is normal.  Testes are descended bilaterally without nodules or tenderness.  No hernias are palpated.  No regional lymphadenopathy palpated   UC Treatments / Results  Labs (all labs ordered are listed, but only abnormal results are displayed) Labs Reviewed  C. TRACHOMATIS/N. GONORRHOEAE RNA  HIV  ANTIBODY (ROUTINE TESTING)    EKG  EKG Interpretation None       Radiology No results found.  Procedures Procedures (including critical care time)  Medications Ordered in UC Medications - No data to display   Initial Impression / Assessment and Plan / UC Course  I have reviewed the triage vital signs and the nursing notes.  Pertinent labs & imaging results that were available during my care of the patient were reviewed by me and considered in my medical decision making (see chart for details).    GC/chlamydia and HIV tests pending.  Patient declines other testing. Avoid sexual contact until afterr results of testing available, and after treatment (if necessary for positive tests).     Final Clinical Impressions(s) / UC Diagnoses   Final diagnoses:  STD exposure    ED Discharge Orders    None          Lattie HawBeese, Kenith Trickel A, MD 05/16/17 1810

## 2017-05-14 NOTE — ED Triage Notes (Signed)
Gonorrhea Exposure

## 2017-05-14 NOTE — ED Notes (Signed)
Blood draw done by Avel SensorMandy Lerner, CMA

## 2017-05-14 NOTE — Discharge Instructions (Signed)
Avoid sexual contact until afterr results of testing available, and after treatment (if necessary for positive tests).

## 2017-05-15 LAB — HIV ANTIBODY (ROUTINE TESTING W REFLEX): HIV: NONREACTIVE

## 2017-05-15 LAB — C. TRACHOMATIS/N. GONORRHOEAE RNA
C. trachomatis RNA, TMA: NOT DETECTED
N. GONORRHOEAE RNA, TMA: NOT DETECTED

## 2017-05-16 ENCOUNTER — Telehealth: Payer: Self-pay

## 2017-05-16 NOTE — Telephone Encounter (Signed)
Spoke with patient, gave lab results.  Will follow up as needed. 

## 2019-05-26 ENCOUNTER — Ambulatory Visit: Payer: 59 | Attending: Internal Medicine

## 2019-05-26 DIAGNOSIS — Z20822 Contact with and (suspected) exposure to covid-19: Secondary | ICD-10-CM

## 2019-05-27 LAB — NOVEL CORONAVIRUS, NAA: SARS-CoV-2, NAA: NOT DETECTED

## 2019-08-09 ENCOUNTER — Ambulatory Visit
Admission: EM | Admit: 2019-08-09 | Discharge: 2019-08-09 | Disposition: A | Discharge status: Home / Self Care | Payer: 59 | Attending: Family Medicine | Admitting: Family Medicine

## 2019-08-09 ENCOUNTER — Other Ambulatory Visit: Payer: Self-pay

## 2019-08-09 DIAGNOSIS — R0981 Nasal congestion: Secondary | ICD-10-CM | POA: Diagnosis not present

## 2019-08-09 DIAGNOSIS — G43009 Migraine without aura, not intractable, without status migrainosus: Secondary | ICD-10-CM

## 2019-08-09 NOTE — ED Triage Notes (Signed)
Pt reports worsening x 4 days.  Over weekend, was manageable with Ibuprofen and Tylenol.  Pain is above R eye.  + for photophobia.  Pt reports increase in pain on expiration, bending over.  Pt concerned that he has been using OTC nasal spray 10-15 times/day for the last year and just found out he should only use it 2-3 times/day. Took 1300mg  of Tylenol today at 1100 with some improvement.   Pt declines COVID test at this time.

## 2019-08-09 NOTE — Discharge Instructions (Signed)
Zyrtec-D or Claritin-D (once or twice a day) Flonase steroid nose spray Continue extra strength tylenol Follow up with Primary Care provider and Ear, Nose, Throat specialist

## 2019-08-22 NOTE — ED Provider Notes (Signed)
[facility]    CSN: [id number] Arrival date & time: 08/09/19  1519      History   Chief Complaint Chief Complaint  Patient presents with  . Headache    HPI Curtis Rodgers is a 29 y.o. male.   29 yo male with a c/o right sided headache for the past 4 days associated with photophobia and mild nausea. States he's also had allergies, nasal congestion. Denies any fevers, chills, neck stiffness, ear pain, numbness/tingling, vision changes. States usually tylenol helps resolve his headaches.    Headache   Past Medical History:  Diagnosis Date  . Asthma     There are no problems to display for this patient.   Past Surgical History:  Procedure Laterality Date  . NO PAST SURGERIES         Home Medications    Prior to Admission medications   Medication Sig Start Date End Date Taking? Authorizing Provider  ibuprofen (ADVIL,MOTRIN) 800 MG tablet Take 1 tablet (800 mg total) by mouth every 8 (eight) hours as needed for mild pain. 09/16/15  Yes Ward, Cyril Mourning N, DO  naproxen sodium (ALEVE) 220 MG tablet Take 440 mg by mouth 2 (two) times daily as needed (for headache).     [provider]    Family History Family History  Problem Relation Age of Onset  . Healthy Mother   . Healthy Father     Social History Social History   Tobacco Use  . Smoking status: Current Some Day Smoker    Types: E-cigarettes  . Smokeless tobacco: Never Used  Substance Use Topics  . Alcohol use: Yes    Alcohol/week: 3.0 standard drinks    Types: 3 Cans of beer per week  . Drug use: No     Allergies   Patient has no known allergies.   Review of Systems Review of Systems  Neurological: Positive for headaches.     Physical Exam Triage Vital Signs ED Triage Vitals  Enc Vitals Group     BP 08/09/19 1543 (!) 117/98     Pulse Rate 08/09/19 1543 68     Resp 08/09/19 1543 18     Temp 08/09/19 1543 98.1 F (36.7 C)     Temp Source 08/09/19 1543 Oral     SpO2  08/09/19 1543 98 %     Weight --      Height --      Head Circumference --      Peak Flow --      Pain Score 08/09/19 1539 2     Pain Loc --      Pain Edu? --      Excl. in [place]? --    No data found.  Updated Vital Signs BP (!) 117/98 (BP Location: Right Arm)   Pulse 68   Temp 98.1 F (36.7 C) (Oral)   Resp 18   SpO2 98%   Visual Acuity Right Eye Distance:   Left Eye Distance:   Bilateral Distance:    Right Eye Near:   Left Eye Near:    Bilateral Near:     Physical Exam Vitals and nursing note reviewed.  Constitutional:      General: He is not in acute distress.    Appearance: He is not toxic-appearing or diaphoretic.  HENT:     Right Ear: Tympanic membrane normal.     Left Ear: Tympanic membrane normal.     Nose: Rhinorrhea present.  Eyes:  Extraocular Movements: Extraocular movements intact.     Conjunctiva/sclera: Conjunctivae normal.     Pupils: Pupils are equal, round, and reactive to light.  Cardiovascular:     Heart sounds: Normal heart sounds.  Pulmonary:     Effort: Pulmonary effort is normal. No respiratory distress.     Breath sounds: Normal breath sounds.  Musculoskeletal:     Cervical back: Normal range of motion and neck supple.  Neurological:     General: No focal deficit present.     Mental Status: He is alert and oriented to person, place, and time.     Cranial Nerves: No cranial nerve deficit.      UC Treatments / Results  Labs (all labs ordered are listed, but only abnormal results are displayed) Labs Reviewed - No data to display  EKG   Radiology No results found.  Procedures Procedures (including critical care time)  Medications Ordered in UC Medications - No data to display  Initial Impression / Assessment and Plan / UC Course  I have reviewed the triage vital signs and the nursing notes.  Pertinent labs & imaging results that were available during my care of the patient were reviewed by me and considered in my  medical decision making (see chart for details).      Final Clinical Impressions(s) / UC Diagnoses   Final diagnoses:  Migraine without aura and without status migrainosus, not intractable  Nasal congestion     Discharge Instructions     Zyrtec-D or Claritin-D (once or twice a day) Flonase steroid nose spray Continue extra strength tylenol Follow up with Primary Care provider and Ear, Nose, Throat specialist    ED Prescriptions    None      1. diagnosis reviewed with patient 2. Recommend supportive treatment with otc medications/analgesics/antihistamines prn 3. Follow-up prn if symptoms worsen or don't improve  PDMP not reviewed this encounter.   Payton Mccallum, MD 08/22/19 [phone]

## 2020-06-21 ENCOUNTER — Ambulatory Visit
Admission: EM | Admit: 2020-06-21 | Discharge: 2020-06-21 | Disposition: A | Discharge status: Home / Self Care | Payer: BC Managed Care – PPO | Attending: Emergency Medicine | Admitting: Emergency Medicine

## 2020-06-21 ENCOUNTER — Other Ambulatory Visit: Payer: Self-pay

## 2020-06-21 DIAGNOSIS — S39012A Strain of muscle, fascia and tendon of lower back, initial encounter: Secondary | ICD-10-CM

## 2020-06-21 MED ORDER — KETOROLAC TROMETHAMINE 10 MG PO TABS: 10.0000 mg | ORAL_TABLET | Freq: Four times a day (QID) | ORAL | 0 refills | Status: DC | PRN

## 2020-06-21 MED ORDER — METHOCARBAMOL 500 MG PO TABS
500.0000 mg | ORAL_TABLET | Freq: Two times a day (BID) | ORAL | 0 refills | Status: DC
Start: 2020-06-21 — End: 2020-07-03

## 2020-06-21 NOTE — Discharge Instructions (Addendum)
Take the Toradol every 6 hours with food on a schedule for the next 48 hours and then you may use on an as-needed basis.  Take the methocarbamol every 6 hours on a schedule with the Toradol for the next 48 hours and then on an as-needed basis.  Apply moist heat to your low back for 20 minutes at a time 2-3 times a day to help improve blood flow and aid in healing.  Follow the back exercises given in the discharge paperwork.  If your symptoms not proved, or they worsen, return for reevaluation or see your primary care provider.

## 2020-06-21 NOTE — ED Triage Notes (Signed)
Pt c/o lower back pain that radiates into the back of his left leg that has increased recently. Pt has taken naproxen with some relief. Pt denies any known injury or heavy lifting. Pt denies any urinary or bowel symptoms.

## 2020-06-21 NOTE — ED Provider Notes (Signed)
[facility]    CSN: [id number] Arrival date & time: 06/21/20  1503      History   Chief Complaint Chief Complaint  Patient presents with  . Back Pain    HPI Curtis Rodgers is a 30 y.o. male.   HPI   30 year old male here for evaluation of low back pain that radiates into his left leg.  Patient reports that he has been experiencing symptoms for the past 2 days.  Patient does not remember doing any heavy lifting or having any kind of injury to his low back.  Patient denies numbness, tingling, or weakness.  Patient is a Runner, broadcasting/film/video by profession and he states that when his symptoms first developed he was administering make-up tests and spent a lot of time walking back and forth to [employer] and sitting more than normal.  Past Medical History:  Diagnosis Date  . Asthma     There are no problems to display for this patient.   Past Surgical History:  Procedure Laterality Date  . NO PAST SURGERIES         Home Medications    Prior to Admission medications   Medication Sig Start Date End Date Taking? Authorizing Provider  ketorolac (TORADOL) 10 MG tablet Take 1 tablet (10 mg total) by mouth every 6 (six) hours as needed. 06/21/20  Yes Becky Augusta, NP  methocarbamol (ROBAXIN) 500 MG tablet Take 1 tablet (500 mg total) by mouth 2 (two) times daily. 06/21/20  Yes Becky Augusta, NP    Family History Family History  Problem Relation Age of Onset  . Healthy Mother   . Healthy Father     Social History Social History   Tobacco Use  . Smoking status: Current Some Day Smoker    Types: E-cigarettes  . Smokeless tobacco: Never Used  Vaping Use  . Vaping Use: Some days  . Substances: Nicotine, Flavoring  Substance Use Topics  . Alcohol use: Yes    Alcohol/week: 3.0 standard drinks    Types: 3 Cans of beer per week  . Drug use: No     Allergies   Patient has no known allergies.   Review of Systems Review of Systems  Constitutional: Negative for  activity change and appetite change.  Musculoskeletal: Positive for back pain.  Neurological: Negative for weakness and numbness.     Physical Exam Triage Vital Signs ED Triage Vitals  Enc Vitals Group     BP 06/21/20 1522 136/87     Pulse Rate 06/21/20 1522 80     Resp 06/21/20 1522 18     Temp 06/21/20 1522 97.9 F (36.6 C)     Temp Source 06/21/20 1522 Oral     SpO2 06/21/20 1522 99 %     Weight 06/21/20 1520 180 lb (81.6 kg)     Height 06/21/20 1520 5\' 10"  (1.778 m)     Head Circumference --      Peak Flow --      Pain Score 06/21/20 1519 6     Pain Loc --      Pain Edu? --      Excl. in [place]? --    No data found.  Updated Vital Signs BP 136/87 (BP Location: Left Arm)   Pulse 80   Temp 97.9 F (36.6 C) (Oral)   Resp 18   Ht 5\' 10"  (1.778 m)   Wt 180 lb (81.6 kg)   SpO2 99%   BMI 25.83 kg/m   Visual  Acuity Right Eye Distance:   Left Eye Distance:   Bilateral Distance:    Right Eye Near:   Left Eye Near:    Bilateral Near:     Physical Exam Vitals and nursing note reviewed.  Constitutional:      General: He is not in acute distress.    Appearance: Normal appearance. He is normal weight. He is not ill-appearing.  Cardiovascular:     Rate and Rhythm: Normal rate and regular rhythm.     Pulses: Normal pulses.     Heart sounds: Normal heart sounds. No murmur heard. No gallop.   Pulmonary:     Effort: Pulmonary effort is normal.     Breath sounds: Normal breath sounds. No wheezing, rhonchi or rales.  Musculoskeletal:        General: Tenderness present. No swelling or deformity. Normal range of motion.  Skin:    General: Skin is warm and dry.     Capillary Refill: Capillary refill takes less than 2 seconds.     Findings: No bruising or erythema.  Neurological:     General: No focal deficit present.     Mental Status: He is alert and oriented to person, place, and time.  Psychiatric:        Mood and Affect: Mood normal.        Behavior: Behavior  normal.        Thought Content: Thought content normal.        Judgment: Judgment normal.      UC Treatments / Results  Labs (all labs ordered are listed, but only abnormal results are displayed) Labs Reviewed - No data to display  EKG   Radiology No results found.  Procedures Procedures (including critical care time)  Medications Ordered in UC Medications - No data to display  Initial Impression / Assessment and Plan / UC Course  I have reviewed the triage vital signs and the nursing notes.  Pertinent labs & imaging results that were available during my care of the patient were reviewed by me and considered in my medical decision making (see chart for details).   Patient is a very pleasant 30 year old male here for evaluation of low back pain.  Patient reports his pain is mostly on the left-hand side and is radiating into his left buttock.  Patient denies any sort of injury or trauma.  Physical exam reveals tenderness and spasm in the paraspinous region on the left-hand side of the lumbar area.  The spasm does extend a little bit into the buttock but does not follow the path of the sciatic nerve.  Patient has a negative straight leg raise bilaterally.  We will treat patient for lumbar strain with Toradol, methocarbamol, moist heat, and back exercises.   Final Clinical Impressions(s) / UC Diagnoses   Final diagnoses:  Strain of lumbar region, initial encounter   Discharge Instructions   None    ED Prescriptions    Medication Sig Dispense Auth. Provider   ketorolac (TORADOL) 10 MG tablet Take 1 tablet (10 mg total) by mouth every 6 (six) hours as needed. 20 tablet Becky Augusta, NP   methocarbamol (ROBAXIN) 500 MG tablet Take 1 tablet (500 mg total) by mouth 2 (two) times daily. 20 tablet Becky Augusta, NP     PDMP not reviewed this encounter.   Becky Augusta, NP 06/21/20 [phone]

## 2020-07-03 ENCOUNTER — Other Ambulatory Visit: Payer: Self-pay

## 2020-07-03 ENCOUNTER — Ambulatory Visit
Admission: EM | Admit: 2020-07-03 | Discharge: 2020-07-03 | Disposition: A | Discharge status: Home / Self Care | Payer: BC Managed Care – PPO | Attending: Family Medicine | Admitting: Family Medicine

## 2020-07-03 ENCOUNTER — Ambulatory Visit (INDEPENDENT_AMBULATORY_CARE_PROVIDER_SITE_OTHER): Payer: BC Managed Care – PPO

## 2020-07-03 DIAGNOSIS — M4186 Other forms of scoliosis, lumbar region: Secondary | ICD-10-CM | POA: Diagnosis not present

## 2020-07-03 DIAGNOSIS — M62830 Muscle spasm of back: Secondary | ICD-10-CM | POA: Diagnosis not present

## 2020-07-03 MED ORDER — PREDNISONE 10 MG PO TABS: ORAL_TABLET | ORAL | 0 refills | Status: DC

## 2020-07-03 MED ORDER — TIZANIDINE HCL 4 MG PO TABS: 4.0000 mg | ORAL_TABLET | Freq: Three times a day (TID) | ORAL | 0 refills | Status: DC | PRN

## 2020-07-03 NOTE — Discharge Instructions (Signed)
Heat.  Medication as prescribed.  If persists, see Ortho -> [facility] Orthopedics [phone]) OR [facility] [phone]).  Take care  Dr. Adriana Simas

## 2020-07-03 NOTE — ED Provider Notes (Signed)
[facility]    CSN: [id number] Arrival date & time: 07/03/20  1454      History   Chief Complaint Chief Complaint  Rodgers presents with  . Back Pain   HPI  30 year old male presents with back pain.  Rodgers reports that he has had back pain over the past month.  He was seen and treated here on 2/17.  He reports initial improvement with treatment but then recurrence of pain again.  Localizes the pain to the left lumbar spine.  Worse with activity and range of motion.  He reports radiation down his buttock.  No saddle ICU.  I cut this.  No other associated symptoms.  No other complaints.  Past Medical History:  Diagnosis Date  . Asthma    Past Surgical History:  Procedure Laterality Date  . NO PAST SURGERIES     Home Medications    Prior to Admission medications   Medication Sig Start Date End Date Taking? Authorizing Provider  predniSONE (DELTASONE) 10 MG tablet 50 mg daily x 2 days, then 40 mg daily x 2 days, then 30 mg daily x 2 days, then 20 mg daily x 2 days, then 10 mg daily x 2 days. 07/03/20  Yes Alita Waldren G, DO  tiZANidine (ZANAFLEX) 4 MG tablet Take 1 tablet (4 mg total) by mouth every 8 (eight) hours as needed for muscle spasms. 07/03/20  Yes Tommie Sams, DO    Family History Family History  Problem Relation Age of Onset  . Healthy Mother   . Healthy Father     Social History Social History   Tobacco Use  . Smoking status: Current Some Day Smoker    Types: E-cigarettes  . Smokeless tobacco: Never Used  Vaping Use  . Vaping Use: Some days  . Substances: Nicotine, Flavoring  Substance Use Topics  . Alcohol use: Yes    Alcohol/week: 3.0 standard drinks    Types: 3 Cans of beer per week  . Drug use: No     Allergies   Rodgers has no known allergies.   Review of Systems Review of Systems  Constitutional: Negative.   Musculoskeletal: Positive for back pain.   Physical Exam Triage Vital Signs ED Triage Vitals  Enc Vitals Group      BP 07/03/20 1511 131/90     Pulse Rate 07/03/20 1511 87     Resp 07/03/20 1511 16     Temp 07/03/20 1511 98.6 F (37 C)     Temp Source 07/03/20 1511 Temporal     SpO2 07/03/20 1511 100 %     Weight 07/03/20 1509 180 lb (81.6 kg)     Height --      Head Circumference --      Peak Flow --      Pain Score 07/03/20 1509 6     Pain Loc --      Pain Edu? --      Excl. in [place]? --    Updated Vital Signs BP 131/90 (BP Location: Right Arm)   Pulse 87   Temp 98.6 F (37 C) (Temporal)   Resp 16   Wt 81.6 kg   SpO2 100%   BMI 25.83 kg/m   Visual Acuity Right Eye Distance:   Left Eye Distance:   Bilateral Distance:    Right Eye Near:   Left Eye Near:    Bilateral Near:     Physical Exam Vitals and nursing note reviewed.  Constitutional:  General: He is not in acute distress.    Appearance: Normal appearance. He is not ill-appearing.  HENT:     Head: Normocephalic and atraumatic.  Eyes:     General:        Right eye: No discharge.        Left eye: No discharge.     Conjunctiva/sclera: Conjunctivae normal.  Cardiovascular:     Rate and Rhythm: Normal rate and regular rhythm.  Pulmonary:     Effort: Pulmonary effort is normal. No respiratory distress.  Musculoskeletal:     Comments: Left lumbar paraspinal muscle spasm.  Neurological:     Mental Status: He is alert.  Psychiatric:        Mood and Affect: Mood normal.        Behavior: Behavior normal.    UC Treatments / Results  Labs (all labs ordered are listed, but only abnormal results are displayed) Labs Reviewed - No data to display  EKG   Radiology DG Lumbar Spine Complete  Result Date: 07/03/2020 CLINICAL DATA:  Back pain EXAM: LUMBAR SPINE - COMPLETE 4+ VIEW COMPARISON:  None. FINDINGS: Subtle levoconvex rotary lumbar scoliosis. No fracture or acute bony findings. SI joints unremarkable. No pars defects identified. Preserved intervertebral disc spaces.  No malalignment. IMPRESSION: 1. Subtle levoconvex  rotary lumbar scoliosis. Otherwise, no significant abnormalities are observed. If pain persists despite conservative therapy, MRI may be warranted for further characterization. Electronically Signed   By: Gaylyn Rong M.D.   On: 07/03/2020 16:08    Procedures Procedures (including critical care time)  Medications Ordered in UC Medications - No data to display  Initial Impression / Assessment and Plan / UC Course  I have reviewed the triage vital signs and the nursing notes.  Pertinent labs & imaging results that were available during my care of the Rodgers were reviewed by me and considered in my medical decision making (see chart for details).    30 year old male presents with back pain.  This is an acute exacerbation of a chronic problem.  X-ray was obtained and was independent reviewed by me.  He has mild scoliosis.  Straightening of the lordotic curve was noted.  This is consistent with muscle spasm.  Stopping methocarbamol.  Starting Zanaflex.  Prednisone as directed.  Supportive care.   Final Clinical Impressions(s) / UC Diagnoses   Final diagnoses:  Lumbar paraspinal muscle spasm     Discharge Instructions     Heat.  Medication as prescribed.  If persists, see Ortho -> [facility] Orthopedics [phone]) OR [facility] [phone]).  Take care  Dr. Adriana Simas     ED Prescriptions    Medication Sig Dispense Auth. Provider   predniSONE (DELTASONE) 10 MG tablet 50 mg daily x 2 days, then 40 mg daily x 2 days, then 30 mg daily x 2 days, then 20 mg daily x 2 days, then 10 mg daily x 2 days. 30 tablet Paislyn Domenico G, DO   tiZANidine (ZANAFLEX) 4 MG tablet Take 1 tablet (4 mg total) by mouth every 8 (eight) hours as needed for muscle spasms. 30 tablet Tommie Sams, DO     PDMP not reviewed this encounter.   Tommie Sams, [place] 07/03/20 1718

## 2020-07-03 NOTE — ED Triage Notes (Signed)
Patient presents to Urgent care for follow-up for back pain. He reports he was prescribed Toradol and Robaxin that provides some relief. He also has performed the exercises that was instructed but have not helped.

## 2020-08-26 ENCOUNTER — Ambulatory Visit
Admission: EM | Admit: 2020-08-26 | Discharge: 2020-08-26 | Disposition: A | Discharge status: Home / Self Care | Payer: BC Managed Care – PPO | Attending: Emergency Medicine | Admitting: Emergency Medicine

## 2020-08-26 ENCOUNTER — Other Ambulatory Visit: Payer: Self-pay

## 2020-08-26 DIAGNOSIS — R519 Headache, unspecified: Secondary | ICD-10-CM

## 2020-08-26 MED ORDER — IBUPROFEN 600 MG PO TABS: 600.0000 mg | ORAL_TABLET | Freq: Four times a day (QID) | ORAL | 0 refills | Status: DC | PRN

## 2020-08-26 MED ORDER — ONDANSETRON 4 MG PO TBDP: 4.0000 mg | ORAL_TABLET | Freq: Three times a day (TID) | ORAL | 0 refills | Status: DC | PRN

## 2020-08-26 NOTE — Discharge Instructions (Signed)
I have placed a referral to [facility] neurology in [place].  Follow-up if you are still having headaches in a week.  Take 600 mg of ibuprofen combined with 1000 mg of Tylenol together 3-4 times a day and the Zofran at the onset of the headache.  Go immediately to the emergency department for the signs and symptoms we discussed.
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
## 2020-08-27 ENCOUNTER — Encounter: Payer: Self-pay | Admitting: Neurology
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
## 2020-09-20 ENCOUNTER — Ambulatory Visit: Payer: BC Managed Care – PPO | Admitting: Neurology
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
## 2020-12-03 ENCOUNTER — Ambulatory Visit: Payer: BC Managed Care – PPO | Admitting: Neurology
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
## 2020-12-24 ENCOUNTER — Ambulatory Visit: Payer: BC Managed Care – PPO | Admitting: Neurology

## 2020-12-26 ENCOUNTER — Ambulatory Visit: Payer: Self-pay | Admitting: Internal Medicine

## 2022-10-17 ENCOUNTER — Emergency Department
Admission: EM | Admit: 2022-10-17 | Discharge: 2022-10-17 | Disposition: A | Discharge status: Home / Self Care | Payer: BC Managed Care – PPO | Attending: Emergency Medicine | Admitting: Emergency Medicine

## 2022-10-17 ENCOUNTER — Encounter: Payer: Self-pay | Admitting: Emergency Medicine

## 2022-10-17 ENCOUNTER — Other Ambulatory Visit: Payer: Self-pay

## 2022-10-17 DIAGNOSIS — S61011A Laceration without foreign body of right thumb without damage to nail, initial encounter: Secondary | ICD-10-CM | POA: Insufficient documentation

## 2022-10-17 DIAGNOSIS — S6991XA Unspecified injury of right wrist, hand and finger(s), initial encounter: Secondary | ICD-10-CM | POA: Diagnosis present

## 2022-10-17 DIAGNOSIS — Y9389 Activity, other specified: Secondary | ICD-10-CM | POA: Insufficient documentation

## 2022-10-17 DIAGNOSIS — W260XXA Contact with knife, initial encounter: Secondary | ICD-10-CM | POA: Insufficient documentation

## 2022-10-17 MED ORDER — CEPHALEXIN 500 MG PO CAPS: 1000.0000 mg | ORAL_CAPSULE | Freq: Two times a day (BID) | ORAL | 0 refills | Status: DC

## 2022-10-17 MED ORDER — LIDOCAINE HCL (PF) 1 % IJ SOLN
10.0000 mL | Freq: Once | INTRAMUSCULAR | Status: AC
  Administered 2022-10-17: 10 mL
  Filled 2022-10-17: qty 10

## 2022-10-17 NOTE — ED Triage Notes (Signed)
  Patient comes in with laceration to R thumb that occurred about an hour ago.  Patient states he was opening a plastic box with his pocket knife and it slipped through his hand and cut him.  Patient has approximately 2 cm laceration to pad of R thumb.  Wound unwrapped and slightly bleeding.  Irrigated with saline flush and wrapped with gauze.  Pain 3/10, throbbing pain in R thumb.  No numbness/tingling or discoloration of finger.

## 2022-10-17 NOTE — ED Provider Notes (Signed)
[facility] Provider Note  Patient Contact: 10:16 PM (approximate)   History   Laceration   HPI  Curtis Rodgers is a 32 y.o. male who presents the emergency department for evaluation of a laceration to the right thumb.  Patient was cutting a plastic box with a knife when it slipped and lacerated his thumb.  Full range of motion preserved.  Bleeding controlled on arrival.  No other injury or complaint.  Tetanus up-to-date.     Physical Exam   Triage Vital Signs: ED Triage Vitals  Enc Vitals Group     BP 10/17/22 2129 (!) 151/126     Pulse Rate 10/17/22 2129 78     Resp 10/17/22 2129 18     Temp 10/17/22 2129 98.5 F (36.9 C)     Temp Source 10/17/22 2129 Oral     SpO2 10/17/22 2129 96 %     Weight 10/17/22 2130 175 lb (79.4 kg)     Height 10/17/22 2130 5\' 10"  (1.778 m)     Head Circumference --      Peak Flow --      Pain Score 10/17/22 2129 3     Pain Loc --      Pain Edu? --      Excl. in GC? --     Most recent vital signs: Vitals:   10/17/22 2129  BP: (!) 151/126  Pulse: 78  Resp: 18  Temp: 98.5 F (36.9 C)  SpO2: 96%     General: Alert and in no acute distress.  Cardiovascular:  Good peripheral perfusion Respiratory: Normal respiratory effort without tachypnea or retractions. Lungs CTAB.  Musculoskeletal: Full range of motion to all extremities.  Laceration in half-moon shaped to the right thumb.  Bleeding controlled.  Full range of motion preserved.  Sensation cap refill intact.  Edges approximately 1 and half centimeters in length.  Smooth in nature. Neurologic:  No gross focal neurologic deficits are appreciated.  Skin:   No rash noted Other:   ED Results / Procedures / Treatments   Labs (all labs ordered are listed, but only abnormal results are displayed) Labs Reviewed - No data to display   EKG     RADIOLOGY    No results found.  PROCEDURES:  Critical Care performed: No  ..Laceration  Repair  Date/Time: 10/17/2022 11:08 PM  Performed by: Racheal Patches, PA-C Authorized by: Racheal Patches, PA-C   Consent:    Consent obtained:  Verbal   Consent given by:  Patient   Risks discussed:  Infection, pain, poor wound healing and need for additional repair Universal protocol:    Procedure explained and questions answered to patient or proxy's satisfaction: yes     Immediately prior to procedure, a time out was called: yes     Patient identity confirmed:  Verbally with patient Anesthesia:    Anesthesia method:  Nerve block   Block location:  R thumb   Block needle gauge:  27 G   Block anesthetic:  Lidocaine 1% WITH epi   Block technique:  Digital block   Block injection procedure:  Anatomic landmarks identified, introduced needle, negative aspiration for blood and incremental injection   Block outcome:  Anesthesia achieved Laceration details:    Location:  Finger   Finger location:  R thumb   Length (cm):  3 Exploration:    Hemostasis achieved with:  Direct pressure   Wound exploration: wound explored through full range of motion and entire  depth of wound visualized     Wound extent: no foreign body, no nerve damage, no tendon damage and no underlying fracture     Contaminated: no   Treatment:    Area cleansed with:  Povidone-iodine and saline   Amount of cleaning:  Standard   Irrigation solution:  Sterile saline   Irrigation volume:  500 ml   Irrigation method:  Syringe Skin repair:    Repair method:  Sutures   Suture size:  4-0   Suture material:  Nylon   Suture technique:  Simple interrupted   Number of sutures:  5 Approximation:    Approximation:  Close Repair type:    Repair type:  Simple Post-procedure details:    Dressing:  Tube gauze   Procedure completion:  Tolerated well, no immediate complications    MEDICATIONS ORDERED IN ED: Medications  lidocaine (PF) (XYLOCAINE) 1 % injection 10 mL (10 mLs Infiltration Given 10/17/22 2249)      IMPRESSION / MDM / ASSESSMENT AND PLAN / ED COURSE  I reviewed the triage vital signs and the nursing notes.                                 Differential diagnosis includes, but is not limited to, finger laceration, tendon laceration, retained foreign body   Patient's presentation is most consistent with acute presentation with potential threat to life or bodily function.   Patient's diagnosis is consistent with thumb laceration.  Patient presents emergency department after lacerating his thumb.  Patient states wound with good hemostasis on arrival.  Full range of motion preserved.  Laceration closed as described above.  Antibiotics prophylactically.  Wound care instructions discussed with the patient.  Have sutures removed in a week.  Follow-up primary care as needed..  Patient is given ED precautions to return to the ED for any worsening or new symptoms.     FINAL CLINICAL IMPRESSION(S) / ED DIAGNOSES   Final diagnoses:  Laceration of right thumb without foreign body without damage to nail, initial encounter     Rx / DC Orders   ED Discharge Orders          Ordered    cephALEXin (KEFLEX) 500 MG capsule  2 times daily        10/17/22 2250             Note:  This document was prepared using Dragon voice recognition software and may include unintentional dictation errors.   Lanette Hampshire 10/17/22 2310    Pilar Jarvis, MD 10/18/22 [phone]

## 2022-10-27 ENCOUNTER — Other Ambulatory Visit: Payer: Self-pay

## 2022-10-27 ENCOUNTER — Emergency Department
Admission: EM | Admit: 2022-10-27 | Discharge: 2022-10-27 | Discharge status: Against Medical Advice | Payer: BC Managed Care – PPO | Attending: Emergency Medicine | Admitting: Emergency Medicine

## 2022-10-27 DIAGNOSIS — Z5321 Procedure and treatment not carried out due to patient leaving prior to being seen by health care provider: Secondary | ICD-10-CM | POA: Insufficient documentation

## 2022-10-27 DIAGNOSIS — Z4802 Encounter for removal of sutures: Secondary | ICD-10-CM | POA: Diagnosis present

## 2022-10-27 NOTE — ED Triage Notes (Signed)
Pt here for suture removal of stitches from right thumb. Pt denies any other issues.

## 2022-10-27 NOTE — ED Notes (Signed)
No answer when called several times from lobby 

## 2024-03-09 ENCOUNTER — Ambulatory Visit
Admission: EM | Admit: 2024-03-09 | Discharge: 2024-03-09 | Disposition: A | Discharge status: Home / Self Care | Payer: Self-pay

## 2024-03-09 ENCOUNTER — Encounter: Payer: Self-pay | Admitting: Emergency Medicine

## 2024-03-09 DIAGNOSIS — R519 Headache, unspecified: Secondary | ICD-10-CM

## 2024-03-09 MED ORDER — RIZATRIPTAN BENZOATE 10 MG PO TABS: 10.0000 mg | ORAL_TABLET | ORAL | 0 refills | Status: DC | PRN

## 2024-03-09 NOTE — ED Provider Notes (Signed)
 [facility]    CSN: [id number] Arrival date & time: 03/09/24  1819      History   Chief Complaint Chief Complaint  Patient presents with   Migraine    HPI Curtis Rodgers is a 33 y.o. male.   HPI  33 year old male with past medical history significant for asthma and cluster headaches presents for evaluation of headaches in the right temporoparietal area that have been coming and going for the last 3 weeks.  She reports that they will always come on 1 hour after he goes to sleep, wake him up from sleep, and last 1 to 2 hours.  Sometimes they improve with a hot towel to the forehead and other times not.  He is also been experiencing occasional headaches in the afternoon.  No recent head trauma.  Past Medical History:  Diagnosis Date   Asthma     There are no active problems to display for this patient.   Past Surgical History:  Procedure Laterality Date   NO PAST SURGERIES         Home Medications    Prior to Admission medications   Medication Sig Start Date End Date Taking? Authorizing Provider  rizatriptan (MAXALT) 10 MG tablet Take 1 tablet (10 mg total) by mouth as needed for migraine. May repeat in 2 hours if needed 03/09/24  Yes Bernardino Ditch, NP  SUBOXONE 8-2 MG FILM Place under the tongue 2 (two) times daily. 03/07/24  Yes [provider]    Family History Family History  Problem Relation Age of Onset   Healthy Mother    Healthy Father     Social History Social History   Tobacco Use   Smoking status: Former    Types: E-cigarettes   Smokeless tobacco: Never  Vaping Use   Vaping status: Some Days   Substances: Nicotine, Flavoring  Substance Use Topics   Alcohol use: Yes    Alcohol/week: 3.0 standard drinks of alcohol    Types: 3 Cans of beer per week   Drug use: No     Allergies   Cat dander and Tree extract   Review of Systems Review of Systems  Eyes:  Positive for photophobia. Negative for visual disturbance.   Gastrointestinal:  Negative for nausea and vomiting.  Neurological:  Positive for dizziness and headaches. Negative for weakness and numbness.     Physical Exam Triage Vital Signs ED Triage Vitals  Encounter Vitals Group     BP      Girls Systolic BP Percentile      Girls Diastolic BP Percentile      Boys Systolic BP Percentile      Boys Diastolic BP Percentile      Pulse      Resp      Temp      Temp src      SpO2      Weight      Height      Head Circumference      Peak Flow      Pain Score      Pain Loc      Pain Education      Exclude from Growth Chart    No data found.  Updated Vital Signs BP 137/85 (BP Location: Right Arm)   Pulse 80   Temp 98.2 F (36.8 C) (Oral)   Resp 15   Wt 183 lb (83 kg)   SpO2 98%   BMI 26.26 kg/m   Visual Acuity  Right Eye Distance:   Left Eye Distance:   Bilateral Distance:    Right Eye Near:   Left Eye Near:    Bilateral Near:     Physical Exam Vitals and nursing note reviewed.  Constitutional:      Appearance: Normal appearance. He is not ill-appearing.  HENT:     Head: Normocephalic and atraumatic.  Skin:    General: Skin is warm and dry.     Capillary Refill: Capillary refill takes less than 2 seconds.  Neurological:     General: No focal deficit present.     Mental Status: He is alert and oriented to person, place, and time.     Cranial Nerves: No cranial nerve deficit.     Sensory: No sensory deficit.     Motor: No weakness.     Coordination: Coordination normal.     Gait: Gait normal.     Deep Tendon Reflexes: Reflexes normal.      UC Treatments / Results  Labs (all labs ordered are listed, but only abnormal results are displayed) Labs Reviewed - No data to display  EKG   Radiology No results found.  Procedures Procedures (including critical care time)  Medications Ordered in UC Medications - No data to display  Initial Impression / Assessment and Plan / UC Course  I have reviewed the  triage vital signs and the nursing notes.  Pertinent labs & imaging results that were available during my care of the patient were reviewed by me and considered in my medical decision making (see chart for details).   Patient is a nontoxic-appearing 33 year old male presenting for evaluation of daily that have been occurring over the last 3 weeks.  He reports that the always come on 1 hour after he goes to sleep and wake him up from sleep.  They typically last 1 to 2 hours and then resolved.  He also has had intermittent headaches during the middle of the day.  He has seen neurology in the past and was diagnosed with cluster headaches but was not given any medication to treat them.  He last saw neurology approximately 2 years ago per his report.  There is no associated numbness, tingling, weakness in any of his extremities.  He has had some intermittent dizziness and light sensitivity.  In the exam room cranial nerves II through XII are intact.  He is moving all extremities equally and independently.  Bilateral grips and upper extremity strength are 5/5, bilateral lower extremity strength is 5/5.  DTRs are 2+ globally.  He has a normal finger-nose and heel-to-shin.  Given that he is having headaches at night after he goes to sleep, and has a history of cluster headaches, this may be cluster headaches.  He also reports that his girlfriend says that he snores at night.  He does not endorse any particular daytime sleepiness.  Another possibility is that he may be experiencing sleep apnea and the apneic periods are what are causing his headaches.  I have advised him that he needs to make an appointment with his primary care provider to discuss a possible sleep study to evaluate for possible sleep apnea.  Will also do a trial of Maxalt for cluster headaches.   Final Clinical Impressions(s) / UC Diagnoses   Final diagnoses:  Chronic daily headache     Discharge Instructions      As we discussed, your  headaches may in fact be your cluster headaches, or they may be a symptom of  possible sleep apnea given that your girlfriend states that you snore.  Make an appointment with your primary care provider to discuss a sleep study to determine if there is sleep apnea playing a role.  I would also call your neurologist to schedule appointment to be reevaluated as the headaches may also be related to your cluster headaches.  I am going to prescribe Maxalt that you can take at the onset of your cluster headache symptoms.  You may repeat dosing every 2 hours with a maximum of 3 tablets in a 24-hour period.     ED Prescriptions     Medication Sig Dispense Auth. Provider   rizatriptan (MAXALT) 10 MG tablet Take 1 tablet (10 mg total) by mouth as needed for migraine. May repeat in 2 hours if needed 10 tablet Bernardino Ditch, NP      PDMP not reviewed this encounter.   Bernardino Ditch, NP 03/09/24 1904

## 2024-03-09 NOTE — Discharge Instructions (Addendum)
 As we discussed, your headaches may in fact be your cluster headaches, or they may be a symptom of possible sleep apnea given that your girlfriend states that you snore.  Make an appointment with your primary care provider to discuss a sleep study to determine if there is sleep apnea playing a role.  I would also call your neurologist to schedule appointment to be reevaluated as the headaches may also be related to your cluster headaches.  I am going to prescribe Maxalt that you can take at the onset of your cluster headache symptoms.  You may repeat dosing every 2 hours with a maximum of 3 tablets in a 24-hour period.

## 2024-03-09 NOTE — ED Triage Notes (Signed)
 Pt presents with migraines for 3 weeks. Pt states he was taking 500mg  tylenol  with no relief. Today he added ibuprofen  with the tylenol . Pt has light sensitivity and some dizziness.

## 2024-03-18 ENCOUNTER — Telehealth: Payer: Self-pay | Admitting: Emergency Medicine

## 2024-03-18 MED ORDER — RIZATRIPTAN BENZOATE 10 MG PO TABS: 10.0000 mg | ORAL_TABLET | ORAL | 0 refills | Status: AC | PRN

## 2024-03-18 NOTE — Telephone Encounter (Signed)
 Patient requesting refill of Maxalt.  Unable to see his PCP at present.  I told him I would send in for 1 additional refill but he needs to see his PCP for medications going forward.
